# Patient Record
Sex: Female | Born: 1985 | Race: Black or African American | Hispanic: No | Marital: Married | Smoking: Never smoker
Health system: Southern US, Community
[De-identification: ages and names within clinical notes are randomized; demographics above are authoritative.]

## PROBLEM LIST (undated history)

## (undated) HISTORY — PX: KNEE SURGERY: SHX244

---

## 2007-11-07 ENCOUNTER — Emergency Department (HOSPITAL_BASED_OUTPATIENT_CLINIC_OR_DEPARTMENT_OTHER): Admission: EM | Admit: 2007-11-07 | Discharge: 2007-11-07 | Payer: Self-pay | Admitting: Emergency Medicine

## 2007-12-25 ENCOUNTER — Ambulatory Visit: Payer: Self-pay | Admitting: Diagnostic Radiology

## 2007-12-25 ENCOUNTER — Emergency Department (HOSPITAL_BASED_OUTPATIENT_CLINIC_OR_DEPARTMENT_OTHER): Admission: EM | Admit: 2007-12-25 | Discharge: 2007-12-25 | Payer: Self-pay | Admitting: Emergency Medicine

## 2008-06-20 ENCOUNTER — Encounter: Payer: Self-pay | Admitting: Emergency Medicine

## 2008-06-20 ENCOUNTER — Inpatient Hospital Stay (HOSPITAL_COMMUNITY): Admission: AD | Admit: 2008-06-20 | Discharge: 2008-06-20 | Payer: Self-pay | Admitting: Obstetrics & Gynecology

## 2008-07-25 ENCOUNTER — Inpatient Hospital Stay (HOSPITAL_COMMUNITY): Admission: AD | Admit: 2008-07-25 | Discharge: 2008-07-25 | Payer: Self-pay | Admitting: Obstetrics and Gynecology

## 2008-08-08 ENCOUNTER — Inpatient Hospital Stay (HOSPITAL_COMMUNITY): Admission: AD | Admit: 2008-08-08 | Discharge: 2008-08-10 | Payer: Self-pay | Admitting: Obstetrics & Gynecology

## 2009-08-01 ENCOUNTER — Ambulatory Visit (HOSPITAL_BASED_OUTPATIENT_CLINIC_OR_DEPARTMENT_OTHER): Admission: RE | Admit: 2009-08-01 | Discharge: 2009-08-02 | Payer: Self-pay | Admitting: Orthopaedic Surgery

## 2010-03-24 LAB — POCT HEMOGLOBIN-HEMACUE: Hemoglobin: 12.1 g/dL (ref 12.0–15.0)

## 2010-04-14 LAB — CBC
HCT: 40.1 % (ref 36.0–46.0)
MCHC: 33.4 g/dL (ref 30.0–36.0)
MCHC: 34.5 g/dL (ref 30.0–36.0)
Platelets: 220 10*3/uL (ref 150–400)
Platelets: 268 10*3/uL (ref 150–400)
RBC: 3.77 MIL/uL — ABNORMAL LOW (ref 3.87–5.11)
RDW: 14.8 % (ref 11.5–15.5)

## 2010-04-16 LAB — POCT I-STAT, CHEM 8
BUN: 8 mg/dL (ref 6–23)
Chloride: 108 mEq/L (ref 96–112)
Creatinine, Ser: 0.8 mg/dL (ref 0.4–1.2)
Potassium: 4.1 mEq/L (ref 3.5–5.1)
Sodium: 136 mEq/L (ref 135–145)

## 2010-04-16 LAB — DIFFERENTIAL
Lymphocytes Relative: 16 % (ref 12–46)
Lymphs Abs: 1.2 10*3/uL (ref 0.7–4.0)
Monocytes Relative: 7 % (ref 3–12)
Neutro Abs: 5.6 10*3/uL (ref 1.7–7.7)
Neutrophils Relative %: 76 % (ref 43–77)

## 2010-04-16 LAB — URINALYSIS, ROUTINE W REFLEX MICROSCOPIC
Glucose, UA: NEGATIVE mg/dL
Hgb urine dipstick: NEGATIVE
Protein, ur: NEGATIVE mg/dL
Specific Gravity, Urine: 1.033 — ABNORMAL HIGH (ref 1.005–1.030)

## 2010-04-16 LAB — URINE MICROSCOPIC-ADD ON

## 2010-04-16 LAB — CBC
MCV: 85.8 fL (ref 78.0–100.0)
Platelets: 271 10*3/uL (ref 150–400)
RBC: 4.05 MIL/uL (ref 3.87–5.11)
WBC: 7.4 10*3/uL (ref 4.0–10.5)

## 2010-04-16 LAB — HCG, QUANTITATIVE, PREGNANCY: hCG, Beta Chain, Quant, S: 6468 m[IU]/mL — ABNORMAL HIGH (ref <5)

## 2010-10-12 LAB — HCG, QUANTITATIVE, PREGNANCY: hCG, Beta Chain, Quant, S: 32629 m[IU]/mL — ABNORMAL HIGH (ref <5)

## 2010-10-12 LAB — URINE MICROSCOPIC-ADD ON

## 2010-10-12 LAB — URINALYSIS, ROUTINE W REFLEX MICROSCOPIC
Bilirubin Urine: NEGATIVE
Ketones, ur: 15 mg/dL — AB
Nitrite: NEGATIVE
Specific Gravity, Urine: 1.026 (ref 1.005–1.030)
Urobilinogen, UA: 1 mg/dL (ref 0.0–1.0)
pH: 6 (ref 5.0–8.0)

## 2010-10-12 LAB — PREGNANCY, URINE: Preg Test, Ur: POSITIVE

## 2010-10-12 LAB — ABO/RH: ABO/RH(D): A POS

## 2013-07-08 ENCOUNTER — Emergency Department (HOSPITAL_BASED_OUTPATIENT_CLINIC_OR_DEPARTMENT_OTHER)
Admission: EM | Admit: 2013-07-08 | Discharge: 2013-07-08 | Disposition: A | Discharge status: Home / Self Care | Payer: BC Managed Care – PPO | Attending: Emergency Medicine | Admitting: Emergency Medicine

## 2013-07-08 ENCOUNTER — Emergency Department (HOSPITAL_BASED_OUTPATIENT_CLINIC_OR_DEPARTMENT_OTHER): Payer: BC Managed Care – PPO

## 2013-07-08 ENCOUNTER — Encounter (HOSPITAL_BASED_OUTPATIENT_CLINIC_OR_DEPARTMENT_OTHER): Payer: Self-pay | Admitting: Emergency Medicine

## 2013-07-08 DIAGNOSIS — Z9889 Other specified postprocedural states: Secondary | ICD-10-CM | POA: Insufficient documentation

## 2013-07-08 DIAGNOSIS — S8991XA Unspecified injury of right lower leg, initial encounter: Secondary | ICD-10-CM

## 2013-07-08 DIAGNOSIS — X500XXA Overexertion from strenuous movement or load, initial encounter: Secondary | ICD-10-CM | POA: Insufficient documentation

## 2013-07-08 DIAGNOSIS — S99919A Unspecified injury of unspecified ankle, initial encounter: Principal | ICD-10-CM

## 2013-07-08 DIAGNOSIS — Y9339 Activity, other involving climbing, rappelling and jumping off: Secondary | ICD-10-CM | POA: Insufficient documentation

## 2013-07-08 DIAGNOSIS — S99929A Unspecified injury of unspecified foot, initial encounter: Principal | ICD-10-CM

## 2013-07-08 DIAGNOSIS — Y9289 Other specified places as the place of occurrence of the external cause: Secondary | ICD-10-CM | POA: Insufficient documentation

## 2013-07-08 DIAGNOSIS — S8990XA Unspecified injury of unspecified lower leg, initial encounter: Secondary | ICD-10-CM | POA: Insufficient documentation
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
## 2016-01-07 IMAGING — CR DG KNEE COMPLETE 4+V*R*
4 series · 4 of 4 positions shown · non-contrast
Comparison: None.

CLINICAL DATA: Leg injury.

EXAM:
RIGHT KNEE - COMPLETE 4+ VIEW

[t knee ap right]
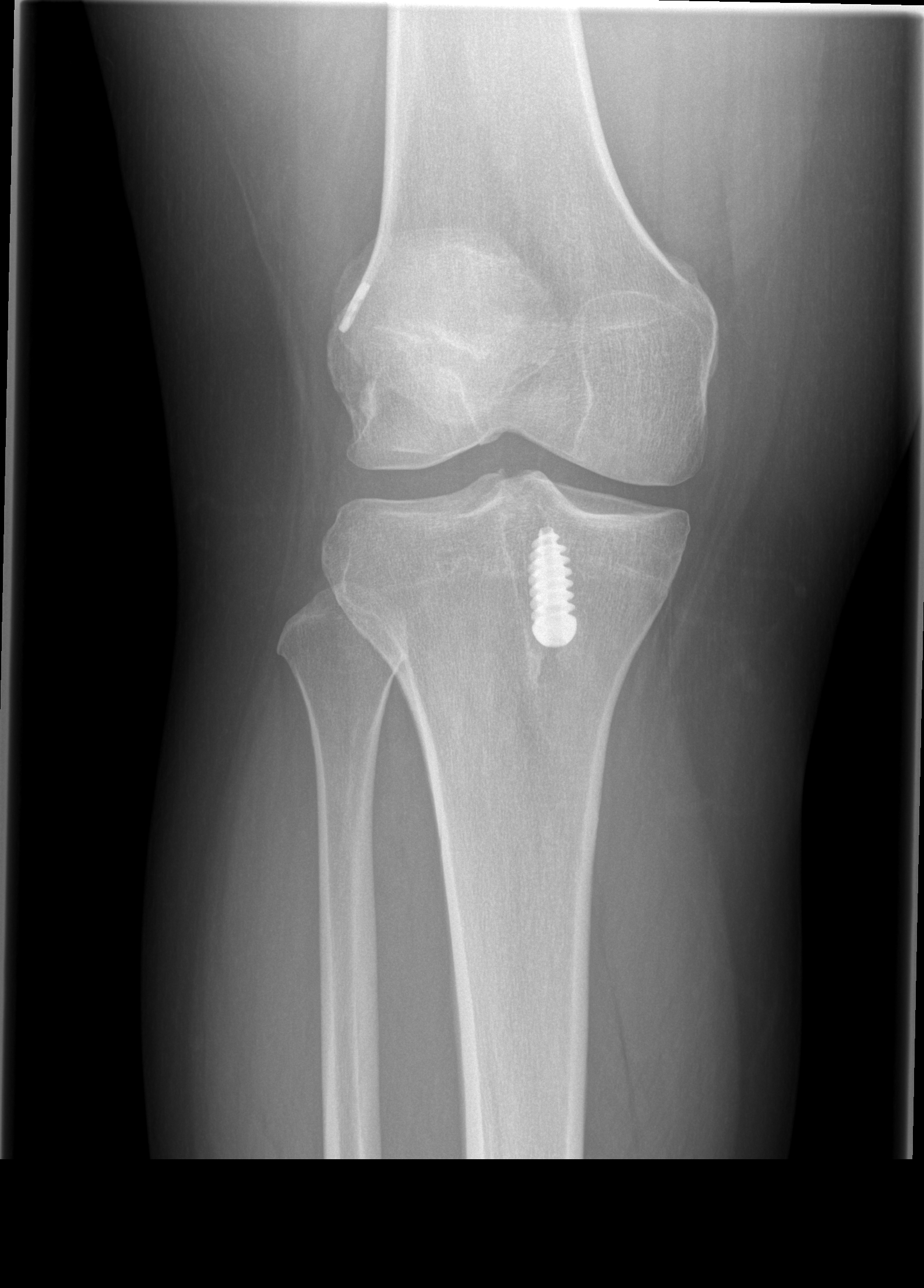

[t knee oblique right (1 of 2)]
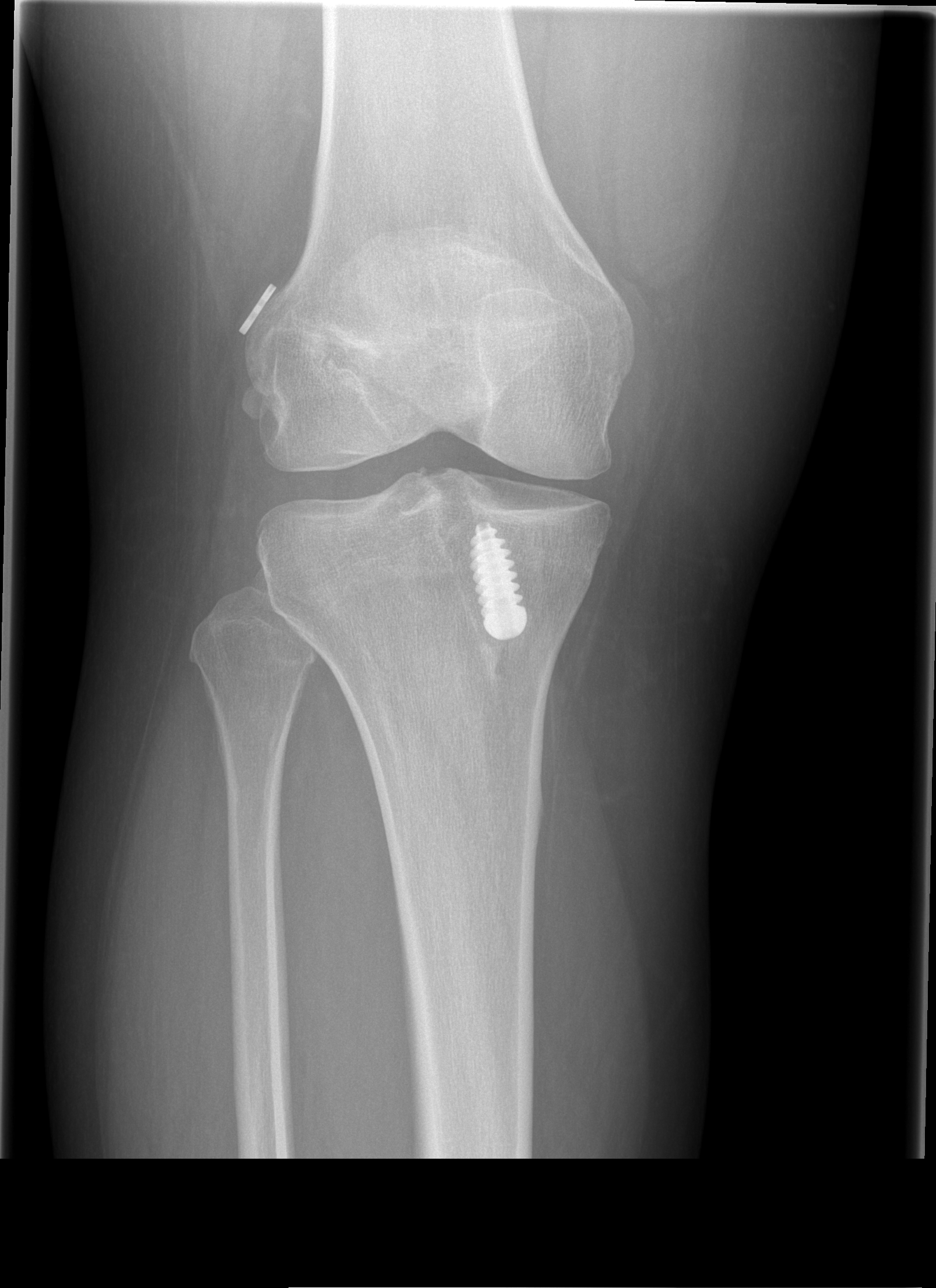

[t knee oblique right (2 of 2)]
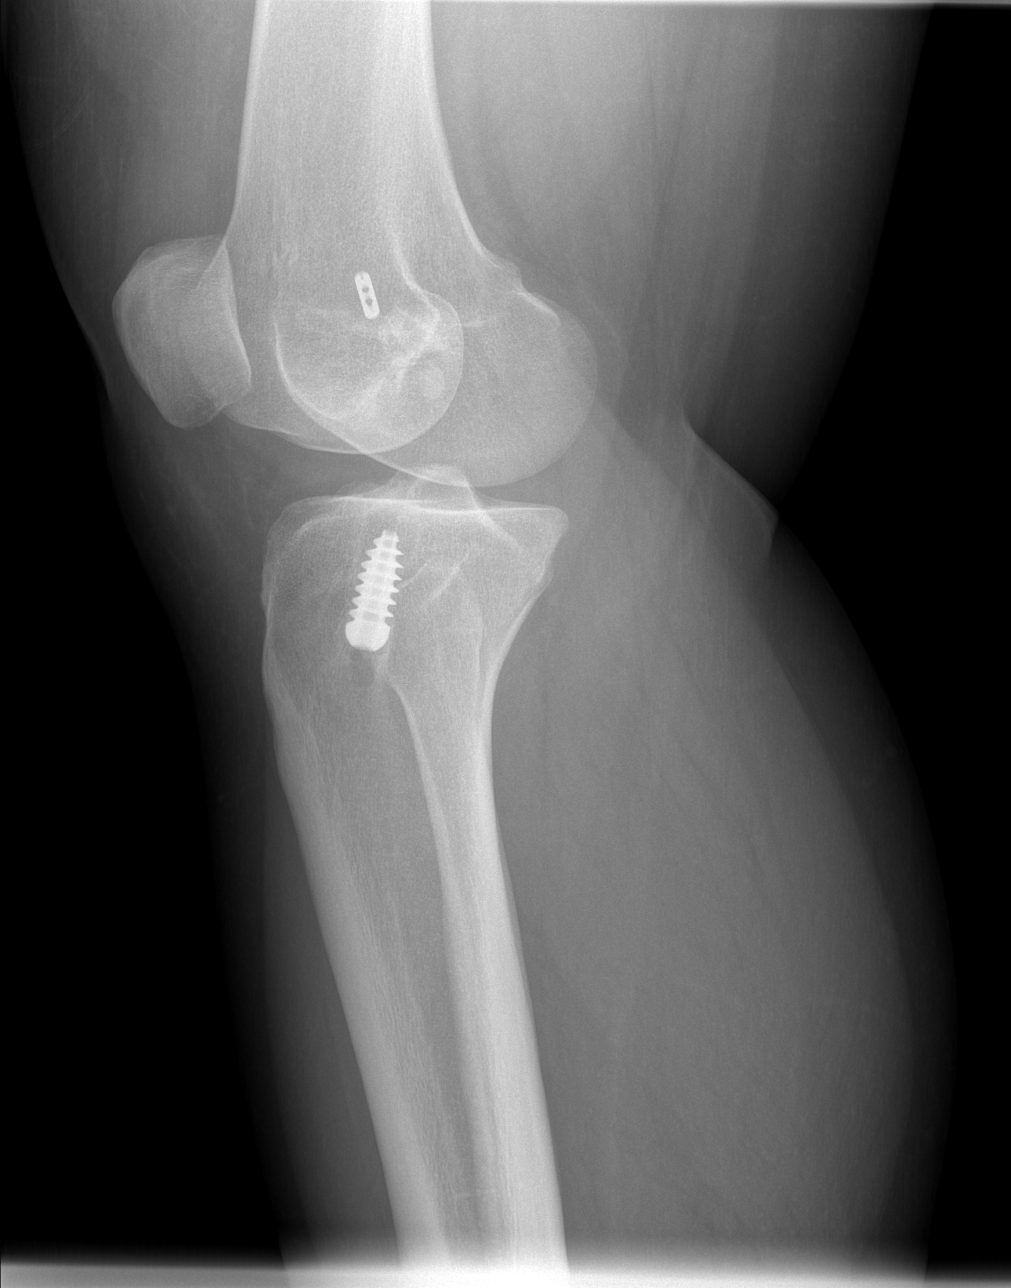

[t knee lat right]
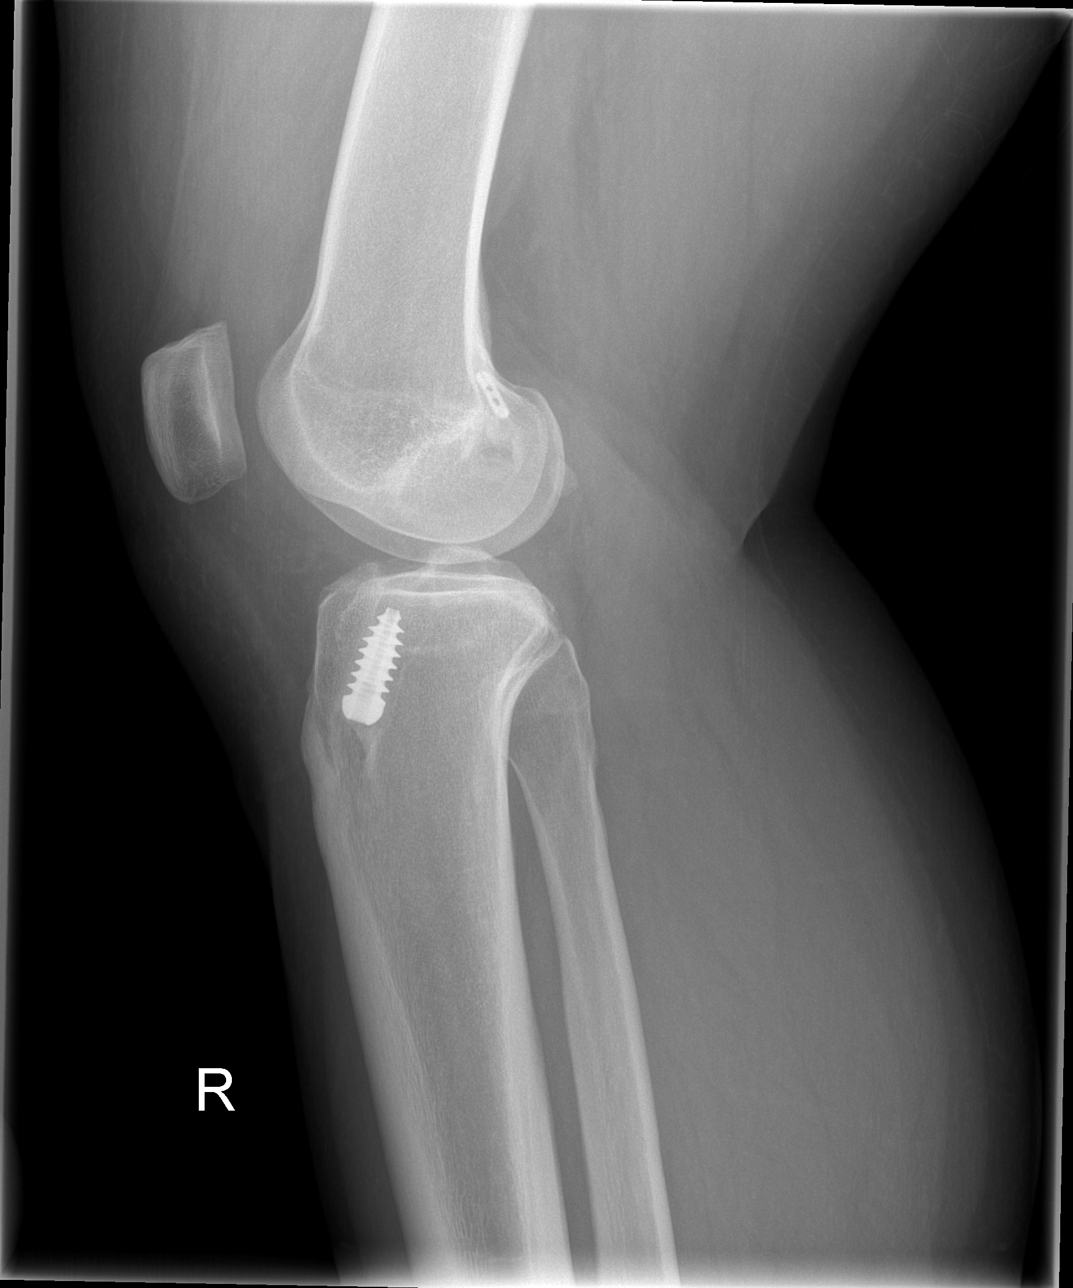

[4 of 4 positions shown; findings below may reference images not displayed]

FINDINGS: There are postsurgical findings consistent with prior ACL
reconstruction. No hardware displacement is evident. The
mineralization and alignment are normal. There is no evidence acute
fracture or dislocation. A small knee joint effusion is evident.
IMPRESSION: No acute osseous findings status post ACL reconstruction. Small
joint effusion.

## 2016-02-04 ENCOUNTER — Encounter (HOSPITAL_BASED_OUTPATIENT_CLINIC_OR_DEPARTMENT_OTHER): Payer: Self-pay | Admitting: Emergency Medicine

## 2016-02-04 DIAGNOSIS — Y9389 Activity, other specified: Secondary | ICD-10-CM | POA: Insufficient documentation

## 2016-02-04 DIAGNOSIS — S299XXA Unspecified injury of thorax, initial encounter: Secondary | ICD-10-CM | POA: Diagnosis present

## 2016-02-04 DIAGNOSIS — M546 Pain in thoracic spine: Secondary | ICD-10-CM | POA: Diagnosis not present

## 2016-02-04 DIAGNOSIS — Y92410 Unspecified street and highway as the place of occurrence of the external cause: Secondary | ICD-10-CM | POA: Insufficient documentation

## 2016-02-04 DIAGNOSIS — Y999 Unspecified external cause status: Secondary | ICD-10-CM | POA: Insufficient documentation
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
## 2016-02-05 ENCOUNTER — Emergency Department (HOSPITAL_BASED_OUTPATIENT_CLINIC_OR_DEPARTMENT_OTHER)
Admission: EM | Admit: 2016-02-05 | Discharge: 2016-02-05 | Disposition: A | Discharge status: Home / Self Care | Payer: BC Managed Care – PPO | Attending: Emergency Medicine | Admitting: Emergency Medicine

## 2016-02-05 DIAGNOSIS — M546 Pain in thoracic spine: Secondary | ICD-10-CM

## 2016-02-05 MED ORDER — CYCLOBENZAPRINE HCL 10 MG PO TABS: 10.0000 mg | ORAL_TABLET | Freq: Two times a day (BID) | ORAL | 0 refills | Status: AC | PRN
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
## 2016-02-05 NOTE — Discharge Instructions (Signed)
Please read attached information. If you experience any new or worsening signs or symptoms please return to the emergency room for evaluation. Please follow-up with your primary care provider or specialist as discussed. Please use medication prescribed only as directed and discontinue taking if you have any concerning signs or symptoms.   °

## 2016-02-05 NOTE — ED Notes (Addendum)
Pt seen by EDPA at this time prior to RN assessment, see PA notes, pending orders.

## 2016-02-05 NOTE — ED Notes (Signed)
C/o general low back pain, 8/10, denies other sx.
